# Patient Record
Sex: Female | Born: 1975 | Race: Black or African American | Hispanic: No | Marital: Married | Smoking: Current every day smoker
Health system: Southern US, Community
[De-identification: ages and names within clinical notes are randomized; demographics above are authoritative.]

## PROBLEM LIST (undated history)

## (undated) DIAGNOSIS — E119 Type 2 diabetes mellitus without complications: Secondary | ICD-10-CM

---

## 2014-03-15 ENCOUNTER — Emergency Department (HOSPITAL_COMMUNITY): Payer: Self-pay

## 2014-03-15 ENCOUNTER — Encounter (HOSPITAL_COMMUNITY): Payer: Self-pay | Admitting: *Deleted

## 2014-03-15 ENCOUNTER — Emergency Department (HOSPITAL_COMMUNITY)
Admission: EM | Admit: 2014-03-15 | Discharge: 2014-03-15 | Disposition: A | Discharge status: Home / Self Care | Payer: Self-pay | Attending: Emergency Medicine | Admitting: Emergency Medicine

## 2014-03-15 DIAGNOSIS — Z794 Long term (current) use of insulin: Secondary | ICD-10-CM | POA: Insufficient documentation

## 2014-03-15 DIAGNOSIS — Z3202 Encounter for pregnancy test, result negative: Secondary | ICD-10-CM | POA: Insufficient documentation

## 2014-03-15 DIAGNOSIS — R1032 Left lower quadrant pain: Secondary | ICD-10-CM

## 2014-03-15 DIAGNOSIS — N8320 Unspecified ovarian cysts: Secondary | ICD-10-CM | POA: Insufficient documentation

## 2014-03-15 DIAGNOSIS — Z79899 Other long term (current) drug therapy: Secondary | ICD-10-CM | POA: Insufficient documentation

## 2014-03-15 DIAGNOSIS — N83209 Unspecified ovarian cyst, unspecified side: Secondary | ICD-10-CM

## 2014-03-15 DIAGNOSIS — Z720 Tobacco use: Secondary | ICD-10-CM | POA: Insufficient documentation

## 2014-03-15 DIAGNOSIS — Z880 Allergy status to penicillin: Secondary | ICD-10-CM | POA: Insufficient documentation

## 2014-03-15 DIAGNOSIS — R102 Pelvic and perineal pain: Secondary | ICD-10-CM

## 2014-03-15 DIAGNOSIS — E119 Type 2 diabetes mellitus without complications: Secondary | ICD-10-CM | POA: Insufficient documentation

## 2014-03-15 HISTORY — DX: Type 2 diabetes mellitus without complications: E11.9

## 2014-03-15 LAB — COMPREHENSIVE METABOLIC PANEL
ALT: 17 U/L (ref 0–35)
AST: 24 U/L (ref 0–37)
Albumin: 3.2 g/dL — ABNORMAL LOW (ref 3.5–5.2)
Alkaline Phosphatase: 41 U/L (ref 39–117)
Anion gap: 14 (ref 5–15)
BUN: 6 mg/dL (ref 6–23)
CALCIUM: 9.1 mg/dL (ref 8.4–10.5)
CHLORIDE: 99 meq/L (ref 96–112)
CO2: 23 mEq/L (ref 19–32)
Creatinine, Ser: 0.66 mg/dL (ref 0.50–1.10)
GFR calc Af Amer: >90 mL/min (ref >90)
GFR calc non Af Amer: >90 mL/min (ref >90)
Glucose, Bld: 109 mg/dL — ABNORMAL HIGH (ref 70–99)
Potassium: 4.3 mEq/L (ref 3.7–5.3)
SODIUM: 136 meq/L — AB (ref 137–147)
Total Bilirubin: <0.2 mg/dL — ABNORMAL LOW (ref 0.3–1.2)
Total Protein: 6.4 g/dL (ref 6.0–8.3)

## 2014-03-15 LAB — CBC
HCT: 34.4 % — ABNORMAL LOW (ref 36.0–46.0)
Hemoglobin: 11.2 g/dL — ABNORMAL LOW (ref 12.0–15.0)
MCH: 28.2 pg (ref 26.0–34.0)
MCHC: 32.6 g/dL (ref 30.0–36.0)
MCV: 86.6 fL (ref 78.0–100.0)
PLATELETS: 253 10*3/uL (ref 150–400)
RBC: 3.97 MIL/uL (ref 3.87–5.11)
RDW: 14.8 % (ref 11.5–15.5)
WBC: 4.3 10*3/uL (ref 4.0–10.5)

## 2014-03-15 LAB — URINALYSIS, ROUTINE W REFLEX MICROSCOPIC
Bilirubin Urine: NEGATIVE
Glucose, UA: NEGATIVE mg/dL
Ketones, ur: NEGATIVE mg/dL
NITRITE: NEGATIVE
Protein, ur: 30 mg/dL — AB
SPECIFIC GRAVITY, URINE: 1.018 (ref 1.005–1.030)
Urobilinogen, UA: 1 mg/dL (ref 0.0–1.0)
pH: 6.5 (ref 5.0–8.0)

## 2014-03-15 LAB — URINE MICROSCOPIC-ADD ON

## 2014-03-15 LAB — POC URINE PREG, ED: Preg Test, Ur: NEGATIVE

## 2014-03-15 MED ORDER — IOHEXOL 300 MG/ML  SOLN
100.0000 mL | Freq: Once | INTRAMUSCULAR | Status: AC | PRN
  Administered 2014-03-15: 100 mL via INTRAVENOUS

## 2014-03-15 MED ORDER — OXYCODONE-ACETAMINOPHEN 5-325 MG PO TABS: 1.0000 | ORAL_TABLET | Freq: Four times a day (QID) | ORAL | Status: AC | PRN

## 2014-03-15 MED ORDER — OXYCODONE-ACETAMINOPHEN 5-325 MG PO TABS
1.0000 | ORAL_TABLET | Freq: Once | ORAL | Status: AC
  Administered 2014-03-15: 1 via ORAL
  Filled 2014-03-15: qty 1

## 2014-03-15 MED ORDER — HYDROMORPHONE HCL 1 MG/ML IJ SOLN
1.0000 mg | Freq: Once | INTRAMUSCULAR | Status: AC
  Administered 2014-03-15: 1 mg via INTRAVENOUS
  Filled 2014-03-15: qty 1

## 2014-03-15 MED ORDER — ONDANSETRON HCL 4 MG/2ML IJ SOLN
4.0000 mg | Freq: Once | INTRAMUSCULAR | Status: AC
  Administered 2014-03-15: 4 mg via INTRAVENOUS
  Filled 2014-03-15: qty 2

## 2014-03-15 NOTE — ED Notes (Signed)
Pt went for ultrasound

## 2014-03-15 NOTE — ED Notes (Addendum)
Pt reports she had unprotected sex with her husband on Sunday and then started having abdominal pain and vaginal bleeding. Pain 8/10. Pt menstrual cycle was 12/9. Pain after urinating. Hx of ovarian cysts and fibroids.

## 2014-03-15 NOTE — Discharge Instructions (Signed)
Ovarian Cyst An ovarian cyst is a fluid-filled sac that forms on an ovary. The ovaries are small organs that produce eggs in women. Various types of cysts can form on the ovaries. Most are not cancerous. Many do not cause problems, and they often go away on their own. Some may cause symptoms and require treatment. Common types of ovarian cysts include:  Functional cysts--These cysts may occur every month during the menstrual cycle. This is normal. The cysts usually go away with the next menstrual cycle if the woman does not get pregnant. Usually, there are no symptoms with a functional cyst.  Endometrioma cysts--These cysts form from the tissue that lines the uterus. They are also called "chocolate cysts" because they become filled with blood that turns brown. This type of cyst can cause pain in the lower abdomen during intercourse and with your menstrual period.  Cystadenoma cysts--This type develops from the cells on the outside of the ovary. These cysts can get very big and cause lower abdomen pain and pain with intercourse. This type of cyst can twist on itself, cut off its blood supply, and cause severe pain. It can also easily rupture and cause a lot of pain.  Dermoid cysts--This type of cyst is sometimes found in both ovaries. These cysts may contain different kinds of body tissue, such as skin, teeth, hair, or cartilage. They usually do not cause symptoms unless they get very big.  Theca lutein cysts--These cysts occur when too much of a certain hormone (human chorionic gonadotropin) is produced and overstimulates the ovaries to produce an egg. This is most common after procedures used to assist with the conception of a baby (in vitro fertilization). CAUSES   Fertility drugs can cause a condition in which multiple large cysts are formed on the ovaries. This is called ovarian hyperstimulation syndrome.  A condition called polycystic ovary syndrome can cause hormonal imbalances that can lead to  nonfunctional ovarian cysts. SIGNS AND SYMPTOMS  Many ovarian cysts do not cause symptoms. If symptoms are present, they may include:  Pelvic pain or pressure.  Pain in the lower abdomen.  Pain during sexual intercourse.  Increasing girth (swelling) of the abdomen.  Abnormal menstrual periods.  Increasing pain with menstrual periods.  Stopping having menstrual periods without being pregnant. DIAGNOSIS  These cysts are commonly found during a routine or annual pelvic exam. Tests may be ordered to find out more about the cyst. These tests may include:  Ultrasound.  X-ray of the pelvis.  CT scan.  MRI.  Blood tests. TREATMENT  Many ovarian cysts go away on their own without treatment. Your health care provider may want to check your cyst regularly for 2-3 months to see if it changes. For women in menopause, it is particularly important to monitor a cyst closely because of the higher rate of ovarian cancer in menopausal women. When treatment is needed, it may include any of the following:  A procedure to drain the cyst (aspiration). This may be done using a long needle and ultrasound. It can also be done through a laparoscopic procedure. This involves using a thin, lighted tube with a tiny camera on the end (laparoscope) inserted through a small incision.  Surgery to remove the whole cyst. This may be done using laparoscopic surgery or an open surgery involving a larger incision in the lower abdomen.  Hormone treatment or birth control pills. These methods are sometimes used to help dissolve a cyst. HOME CARE INSTRUCTIONS   Only take over-the-counter   or prescription medicines as directed by your health care provider.  Follow up with your health care provider as directed.  Get regular pelvic exams and Pap tests. SEEK MEDICAL CARE IF:   Your periods are late, irregular, or painful, or they stop.  Your pelvic pain or abdominal pain does not go away.  Your abdomen becomes  larger or swollen.  You have pressure on your bladder or trouble emptying your bladder completely.  You have pain during sexual intercourse.  You have feelings of fullness, pressure, or discomfort in your stomach.  You lose weight for no apparent reason.  You feel generally ill.  You become constipated.  You lose your appetite.  You develop acne.  You have an increase in body and facial hair.  You are gaining weight, without changing your exercise and eating habits.  You think you are pregnant. SEEK IMMEDIATE MEDICAL CARE IF:   You have increasing abdominal pain.  You feel sick to your stomach (nauseous), and you throw up (vomit).  You develop a fever that comes on suddenly.  You have abdominal pain during a bowel movement.  Your menstrual periods become heavier than usual. MAKE SURE YOU:  Understand these instructions.  Will watch your condition.  Will get help right away if you are not doing well or get worse. Document Released: 03/12/2005 Document Revised: 03/17/2013 Document Reviewed: 11/17/2012 [employer] Patient Information 2015 [employer]. This information is not intended to replace advice given to you by your health care provider. Make sure you discuss any questions you have with your health care provider.  

## 2014-03-15 NOTE — ED Provider Notes (Signed)
CSN: [id number]     Arrival date & time 03/15/14  1701 History   First MD Initiated Contact with Patient 03/15/14 1814     Chief Complaint  Patient presents with  . Abdominal Pain  . Vaginal Bleeding     (Consider location/radiation/quality/duration/timing/severity/associated sxs/prior Treatment) Patient is a 38 y.o. female presenting with abdominal pain and vaginal bleeding. The history is provided by the patient.  Abdominal Pain Pain location:  LLQ and suprapubic Pain quality: aching and sharp   Pain radiates to:  Does not radiate Pain severity:  Moderate Onset quality:  Sudden Timing:  Constant Progression:  Unchanged Chronicity:  New Context: recent sexual activity (mild discomfort during sex, pain intensified afterwards)   Context: not trauma   Associated symptoms: nausea, vaginal bleeding and vomiting   Associated symptoms: no cough, no diarrhea, no fever and no shortness of breath   Vaginal Bleeding Associated symptoms: abdominal pain and nausea   Associated symptoms: no fever     Past Medical History  Diagnosis Date  . Diabetes mellitus without complication    History reviewed. No pertinent past surgical history. History reviewed. No pertinent family history. History  Substance Use Topics  . Smoking status: Current Every Day Smoker -- 0.50 packs/day for 20 years    Types: Cigarettes  . Smokeless tobacco: Not on file  . Alcohol Use: No   OB History    No data available     Review of Systems  Constitutional: Negative for fever.  Respiratory: Negative for cough and shortness of breath.   Gastrointestinal: Positive for nausea, vomiting and abdominal pain. Negative for diarrhea.  Genitourinary: Positive for vaginal bleeding.  All other systems reviewed and are negative.     Allergies  Penicillins and Reglan  Home Medications   Prior to Admission medications   Medication Sig Start Date End Date Taking? Authorizing Provider  Cyanocobalamin (VITAMIN  B-12 PO) Take 1 tablet by mouth daily.   Yes Historical Provider, MD  ibuprofen (ADVIL,MOTRIN) 200 MG tablet Take 600 mg by mouth every 6 (six) hours as needed for headache or moderate pain.   Yes Historical Provider, MD  insulin glargine (LANTUS) 100 UNIT/ML injection Inject 30 Units into the skin 2 (two) times daily.   Yes Historical Provider, MD   BP 145/116 mmHg  Pulse 105  Temp(Src) 98.4 F (36.9 C) (Oral)  Resp 16  SpO2 100% Physical Exam  Constitutional: She is oriented to person, place, and time. She appears well-developed and well-nourished. No distress.  HENT:  Head: Normocephalic and atraumatic.  Mouth/Throat: Oropharynx is clear and moist. No oropharyngeal exudate.  Eyes: EOM are normal. Pupils are equal, round, and reactive to light.  Neck: Normal range of motion. Neck supple.  Cardiovascular: Normal rate and regular rhythm.  Exam reveals no friction rub.   No murmur heard. Pulmonary/Chest: Effort normal and breath sounds normal. No respiratory distress. She has no wheezes. She has no rales.  Abdominal: Soft. She exhibits no distension. There is tenderness (LLQ, suprapubic, moderate). There is no rebound.  Musculoskeletal: Normal range of motion. She exhibits no edema.  Neurological: She is alert and oriented to person, place, and time. No cranial nerve deficit. She exhibits normal muscle tone. Coordination normal.  Skin: Skin is warm. No rash noted. She is not diaphoretic.  Nursing note and vitals reviewed.   ED Course  Procedures (including critical care time) Labs Review Labs Reviewed  URINALYSIS, ROUTINE W REFLEX MICROSCOPIC  CBC  COMPREHENSIVE METABOLIC PANEL  POC  URINE PREG, ED    Imaging Review [place] Transvaginal Non-ob  03/15/2014   CLINICAL DATA:  Left lower quadrant abdominal/pelvic pain. History of right oophorectomy. Initial encounter.  EXAM: TRANSABDOMINAL AND TRANSVAGINAL ULTRASOUND OF PELVIS  DOPPLER ULTRASOUND OF OVARIES  TECHNIQUE: Both  transabdominal and transvaginal ultrasound examinations of the pelvis were performed. Transabdominal technique was performed for global imaging of the pelvis including uterus, ovaries, adnexal regions, and pelvic cul-de-sac.  It was necessary to proceed with endovaginal exam following the transabdominal exam to visualize the endometrium and ovaries. Color and duplex Doppler ultrasound was utilized to evaluate blood flow to the ovaries.  COMPARISON:  None.  FINDINGS: Uterus  Measurements: 7.8 x 4.2 x 6.2 cm. There is an intramural fibroid within the right uterine body, measuring 3.1 x 2.9 x 3.0 cm.  Endometrium  Thickness: 4.4 mm.  No focal abnormality visualized.  Right ovary  Measurements: Surgically absent. No evidence of adnexal mass.  Left ovary  Measurements: 3.5 x 2.9 x 2.4 cm. There is a small lesion within the left ovary which demonstrates mildly increased echogenicity, measuring 1.6 cm maximally. Blood flow is present within the left ovary on color Doppler. There is no adnexal mass  Pulsed Doppler evaluation of both ovaries demonstrates normal low-resistance arterial and venous waveforms.  Other findings  Trace free pelvic fluid is present.  IMPRESSION: 1. No evidence of left ovarian torsion status post right oophorectomy. 2. There is a small echogenic lesion within the left ovary, likely a hemorrhagic cyst/follicle. 3. Right uterine fibroid.   Electronically Signed   By: Roxy HorsemanBill  Veazey M.D.   On: 03/15/2014 20:21   [place] Pelvis Complete  03/15/2014   CLINICAL DATA:  Left lower quadrant abdominal/pelvic pain. History of right oophorectomy. Initial encounter.  EXAM: TRANSABDOMINAL AND TRANSVAGINAL ULTRASOUND OF PELVIS  DOPPLER ULTRASOUND OF OVARIES  TECHNIQUE: Both transabdominal and transvaginal ultrasound examinations of the pelvis were performed. Transabdominal technique was performed for global imaging of the pelvis including uterus, ovaries, adnexal regions, and pelvic cul-de-sac.  It was necessary to  proceed with endovaginal exam following the transabdominal exam to visualize the endometrium and ovaries. Color and duplex Doppler ultrasound was utilized to evaluate blood flow to the ovaries.  COMPARISON:  None.  FINDINGS: Uterus  Measurements: 7.8 x 4.2 x 6.2 cm. There is an intramural fibroid within the right uterine body, measuring 3.1 x 2.9 x 3.0 cm.  Endometrium  Thickness: 4.4 mm.  No focal abnormality visualized.  Right ovary  Measurements: Surgically absent. No evidence of adnexal mass.  Left ovary  Measurements: 3.5 x 2.9 x 2.4 cm. There is a small lesion within the left ovary which demonstrates mildly increased echogenicity, measuring 1.6 cm maximally. Blood flow is present within the left ovary on color Doppler. There is no adnexal mass  Pulsed Doppler evaluation of both ovaries demonstrates normal low-resistance arterial and venous waveforms.  Other findings  Trace free pelvic fluid is present.  IMPRESSION: 1. No evidence of left ovarian torsion status post right oophorectomy. 2. There is a small echogenic lesion within the left ovary, likely a hemorrhagic cyst/follicle. 3. Right uterine fibroid.   Electronically Signed   By: Roxy HorsemanBill  Veazey M.D.   On: 03/15/2014 20:21   Ct Abdomen Pelvis W Contrast  03/15/2014   CLINICAL DATA:  Abdominal pain and vaginal bleeding.  EXAM: CT ABDOMEN AND PELVIS WITH CONTRAST  TECHNIQUE: Multidetector CT imaging of the abdomen and pelvis was performed using the standard protocol following bolus administration of intravenous  contrast.  CONTRAST:  100mL OMNIPAQUE IOHEXOL 300 MG/ML  SOLN  COMPARISON:  Prior ultrasound performed earlier on the same day.  FINDINGS: The visualized lung bases are clear.  3.5 cm heterogeneously enhancing lesion within the right hepatic lobe most likely reflects a benign hemangioma. Liver is otherwise unremarkable. Gallbladder is absent. No biliary dilatation. The spleen, adrenal glands, and pancreas demonstrate a normal contrast enhanced  appearance.  Kidneys are equal in size with symmetric enhancement. No nephrolithiasis, hydronephrosis, or focal enhancing renal mass.  Stomach within normal limits. No evidence for bowel obstruction. No abnormal wall thickening, mucosal enhancement, or inflammatory fat stranding seen about the bowels. No evidence for acute appendicitis. Enteric contrast material reaches the level the rectum.  Bladder within normal limits. 3.1 cm fibroid present within the right uterine body. Left ovary unremarkable. Right ovary visualized.  No free air or fluid. No pathologically enlarged intra-abdominal pelvic lymph nodes. Normal intravascular enhancement seen within the abdomen and pelvis.  No acute osseous abnormality. No worrisome lytic or blastic osseous lesions.  IMPRESSION: 1. No CT evidence for acute intra-abdominal or pelvic process. 2. 3.5 cm probable benign hemangioma within the right hepatic lobe. 3. Fibroid uterus.   Electronically Signed   By: Rise MuBenjamin  McClintock M.D.   On: 03/15/2014 21:44   [place] Art/ven Flow Abd Pelv Doppler  03/15/2014   CLINICAL DATA:  Left lower quadrant abdominal/pelvic pain. History of right oophorectomy. Initial encounter.  EXAM: TRANSABDOMINAL AND TRANSVAGINAL ULTRASOUND OF PELVIS  DOPPLER ULTRASOUND OF OVARIES  TECHNIQUE: Both transabdominal and transvaginal ultrasound examinations of the pelvis were performed. Transabdominal technique was performed for global imaging of the pelvis including uterus, ovaries, adnexal regions, and pelvic cul-de-sac.  It was necessary to proceed with endovaginal exam following the transabdominal exam to visualize the endometrium and ovaries. Color and duplex Doppler ultrasound was utilized to evaluate blood flow to the ovaries.  COMPARISON:  None.  FINDINGS: Uterus  Measurements: 7.8 x 4.2 x 6.2 cm. There is an intramural fibroid within the right uterine body, measuring 3.1 x 2.9 x 3.0 cm.  Endometrium  Thickness: 4.4 mm.  No focal abnormality visualized.   Right ovary  Measurements: Surgically absent. No evidence of adnexal mass.  Left ovary  Measurements: 3.5 x 2.9 x 2.4 cm. There is a small lesion within the left ovary which demonstrates mildly increased echogenicity, measuring 1.6 cm maximally. Blood flow is present within the left ovary on color Doppler. There is no adnexal mass  Pulsed Doppler evaluation of both ovaries demonstrates normal low-resistance arterial and venous waveforms.  Other findings  Trace free pelvic fluid is present.  IMPRESSION: 1. No evidence of left ovarian torsion status post right oophorectomy. 2. There is a small echogenic lesion within the left ovary, likely a hemorrhagic cyst/follicle. 3. Right uterine fibroid.   Electronically Signed   By: Roxy HorsemanBill  Veazey M.D.   On: 03/15/2014 20:21     EKG Interpretation None      MDM   Final diagnoses:  Adnexal tenderness, left  LLQ pain  Hemorrhagic cyst of ovary    38 here with LLQ pain after sex yesterday. Hx of ovarian cysts, but no pain like this before.  On exam, LLQ tenderness, severe L adnexal tenderness. US shows no torsion, but hemorrhagic cyst CT-scan of the abdomen negative. Stable for discharge.    Elwin MochaBlair Ellyse Rotolo, MD 03/15/14 2223

## 2014-03-15 NOTE — ED Notes (Signed)
Per patient, only wants to be stuck once for IV, refused blood draw

## 2016-07-06 IMAGING — CT CT ABD-PELV W/ CM
1 of 2 series · 15 of 32 positions shown, 19 images · IV contrast (OMNIPAQUE 300)
Comparison: Prior ultrasound performed earlier on the same day.

CLINICAL DATA: Abdominal pain and vaginal bleeding.

EXAM:
CT ABDOMEN AND PELVIS WITH CONTRAST
TECHNIQUE: Multidetector CT imaging of the abdomen and pelvis was performed
using the standard protocol following bolus administration of
intravenous contrast.
CONTRAST:  100mL OMNIPAQUE IOHEXOL 300 MG/ML  SOLN

[Series 2: abd/pel with · axial · 0.78mm/px · z∈[+1112,+1546]mm · 15 of 95 slices shown, 19 images]
[im 4/95  soft-tissue]
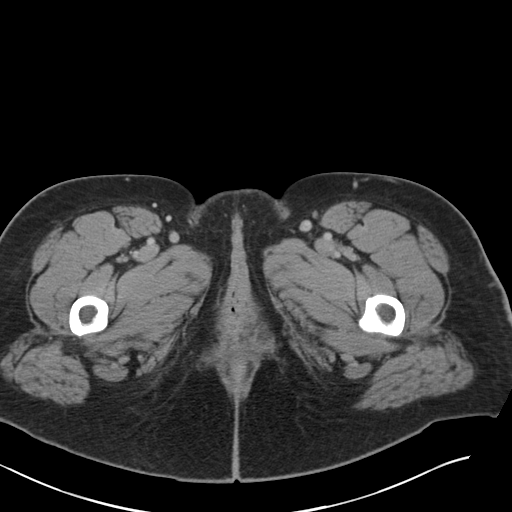
[im 4/95  bone]
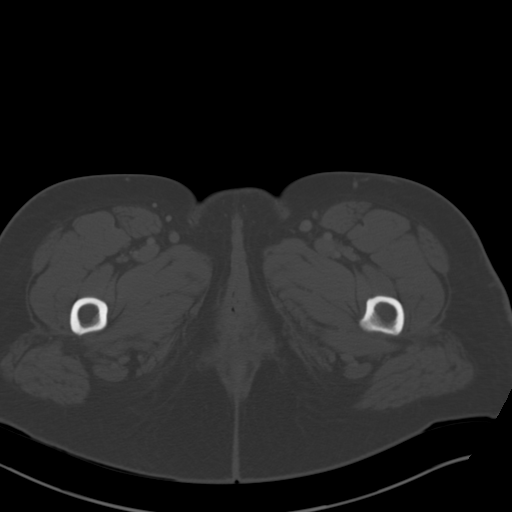
[im 12/95  soft-tissue]
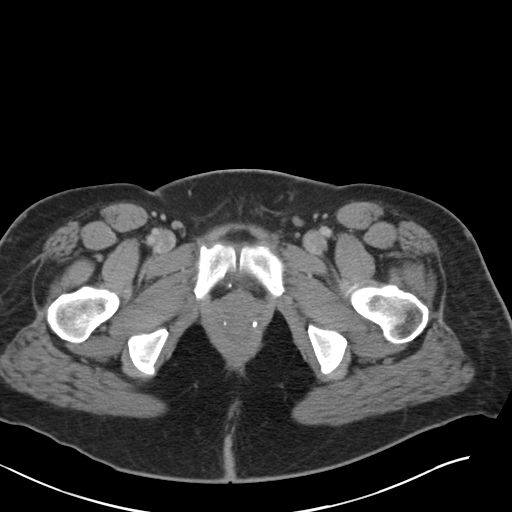
[im 20/95  soft-tissue]
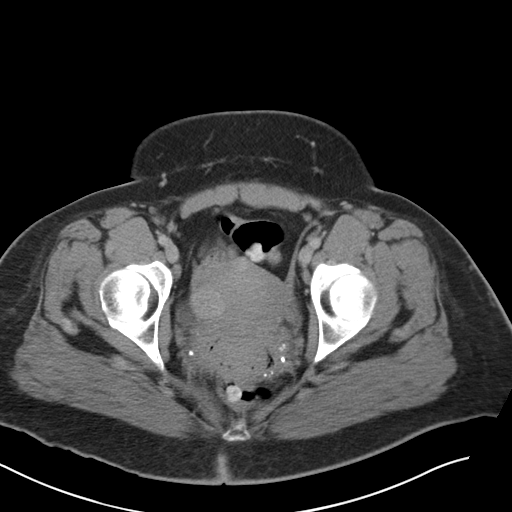
[im 28/95  soft-tissue]
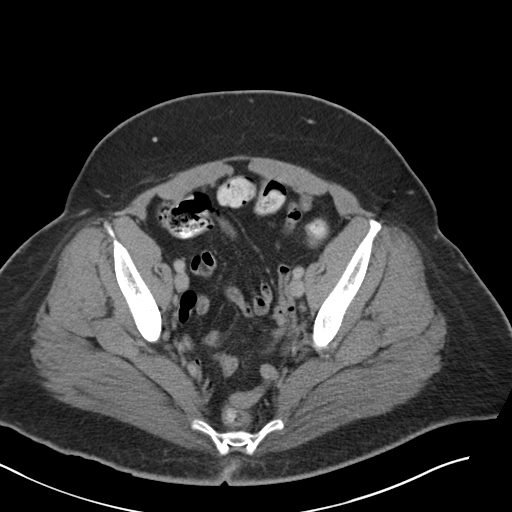
[im 32/95  soft-tissue]
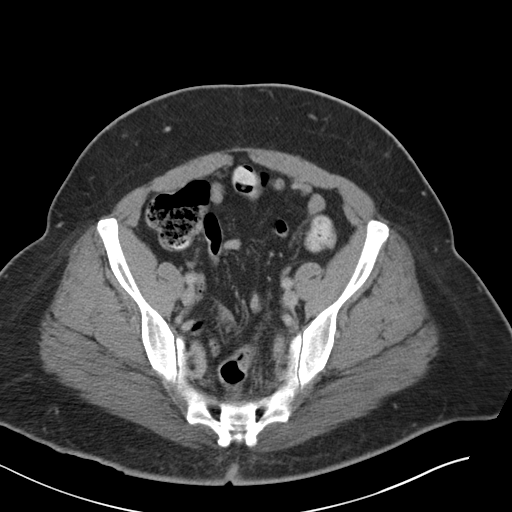
[im 40/95  soft-tissue]
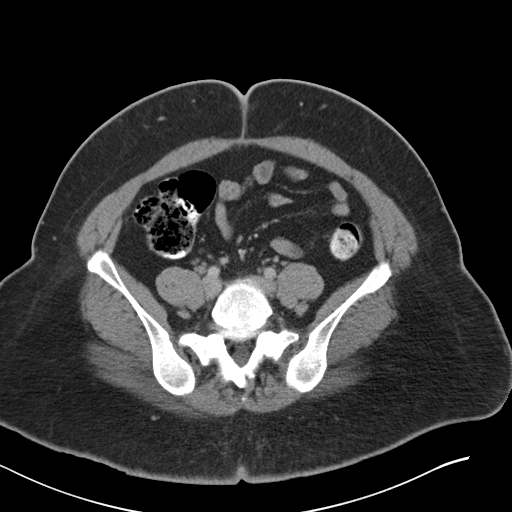
[im 48/95  soft-tissue]
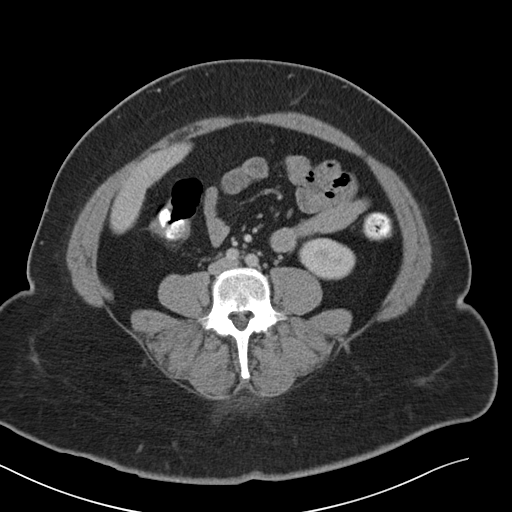
[im 55/95  soft-tissue]
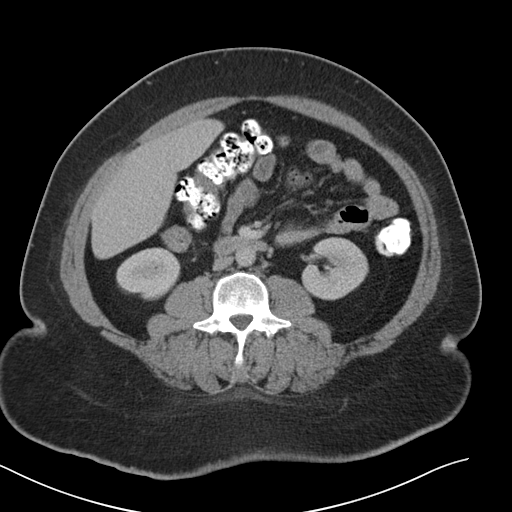
[im 63/95  soft-tissue]
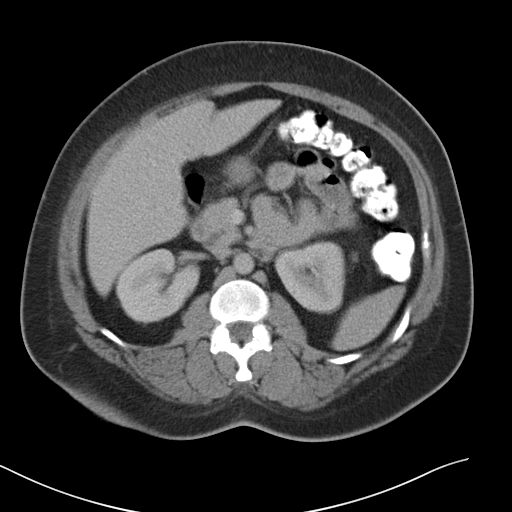
[im 63/95  bone]
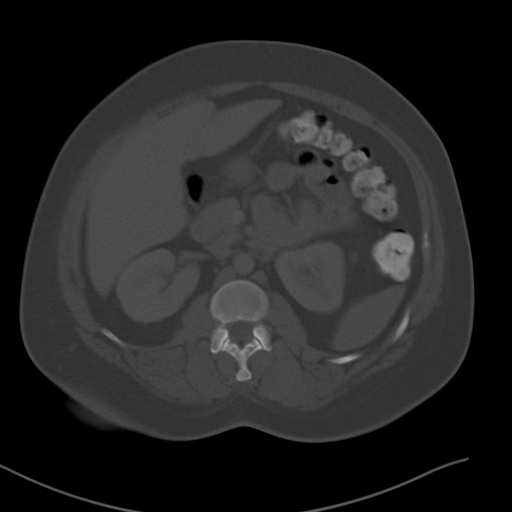
[im 67/95  soft-tissue]
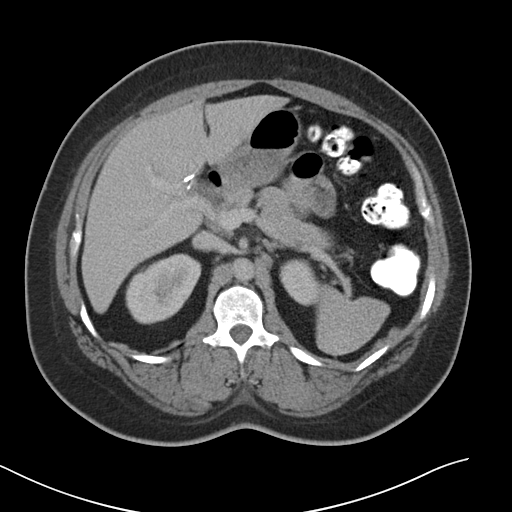
[im 75/95  soft-tissue]
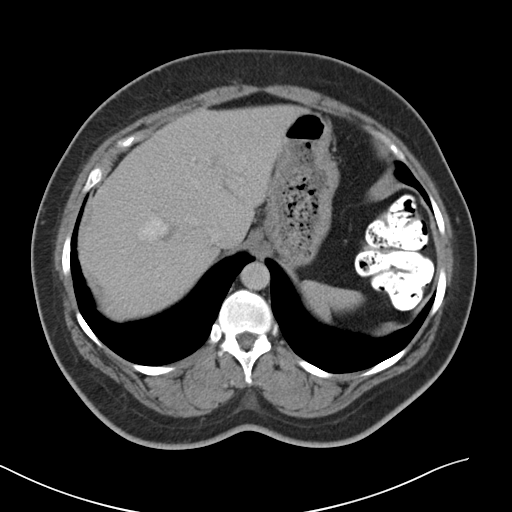
[im 79/95  lung]
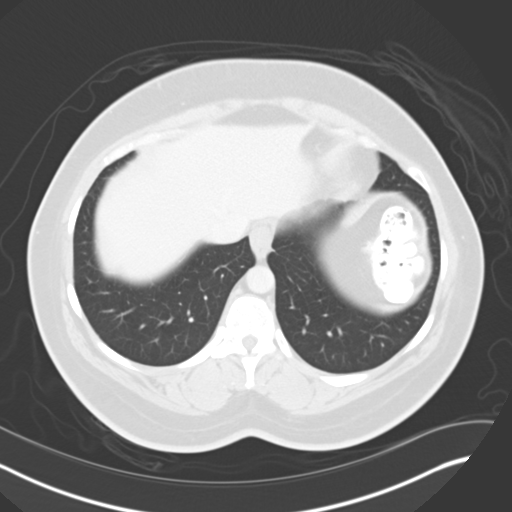
[im 83/95  soft-tissue]
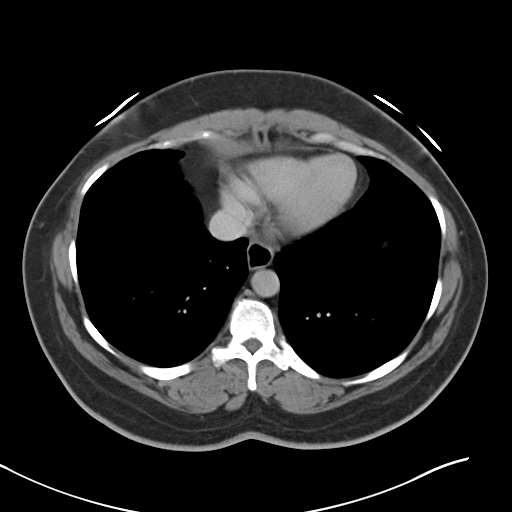
[im 83/95  lung]
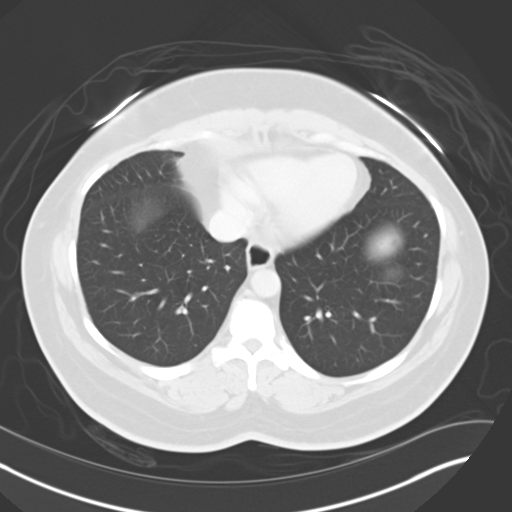
[im 87/95  lung]
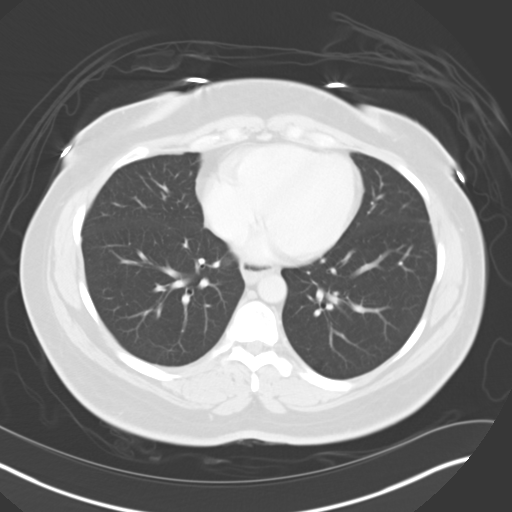
[im 91/95  soft-tissue]
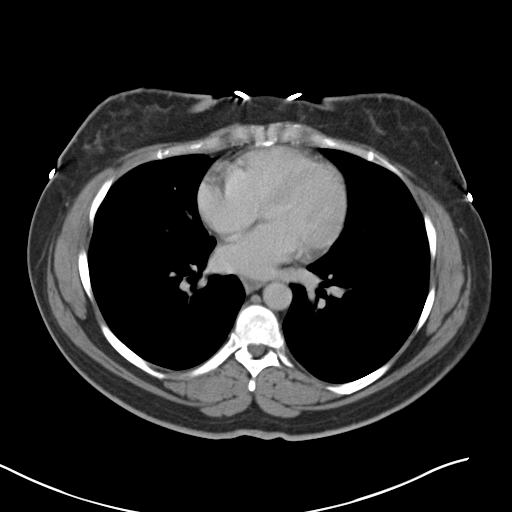
[im 91/95  lung]
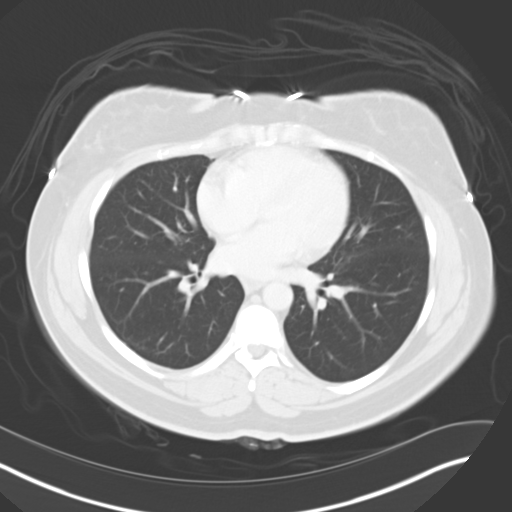

[15 of 32 positions shown; findings below may reference images not displayed]

FINDINGS: The visualized lung bases are clear.

3.5 cm heterogeneously enhancing lesion within the right hepatic
lobe most likely reflects a benign hemangioma. Liver is otherwise
unremarkable. Gallbladder is absent. No biliary dilatation. The
spleen, adrenal glands, and pancreas demonstrate a normal contrast
enhanced appearance.

Kidneys are equal in size with symmetric enhancement. No
nephrolithiasis, hydronephrosis, or focal enhancing renal mass.

Stomach within normal limits. No evidence for bowel obstruction. No
abnormal wall thickening, mucosal enhancement, or inflammatory fat
stranding seen about the bowels. No evidence for acute appendicitis.
Enteric contrast material reaches the level the rectum.

Bladder within normal limits. 3.1 cm fibroid present within the
right uterine body. Left ovary unremarkable. Right ovary visualized.

No free air or fluid. No pathologically enlarged intra-abdominal
pelvic lymph nodes. Normal intravascular enhancement seen within the
abdomen and pelvis.

No acute osseous abnormality. No worrisome lytic or blastic osseous
lesions.
IMPRESSION: 1. No CT evidence for acute intra-abdominal or pelvic process.
2. 3.5 cm probable benign hemangioma within the right hepatic lobe.
3. Fibroid uterus.
